# Patient Record
Sex: Male | Born: 1985 | Hispanic: Yes | Marital: Married | Smoking: Never smoker
Health system: Southern US, Community
[De-identification: ages and names within clinical notes are randomized; demographics above are authoritative.]

---

## 2017-03-14 ENCOUNTER — Encounter (HOSPITAL_COMMUNITY): Payer: Self-pay | Admitting: Emergency Medicine

## 2017-03-14 ENCOUNTER — Emergency Department (HOSPITAL_COMMUNITY)
Admission: EM | Admit: 2017-03-14 | Discharge: 2017-03-14 | Disposition: A | Discharge status: Home / Self Care | Payer: No Typology Code available for payment source | Attending: Emergency Medicine | Admitting: Emergency Medicine

## 2017-03-14 DIAGNOSIS — Y92410 Unspecified street and highway as the place of occurrence of the external cause: Secondary | ICD-10-CM | POA: Insufficient documentation

## 2017-03-14 DIAGNOSIS — Y999 Unspecified external cause status: Secondary | ICD-10-CM | POA: Diagnosis not present

## 2017-03-14 DIAGNOSIS — S0990XA Unspecified injury of head, initial encounter: Secondary | ICD-10-CM | POA: Diagnosis not present

## 2017-03-14 DIAGNOSIS — Y9389 Activity, other specified: Secondary | ICD-10-CM | POA: Diagnosis not present

## 2017-03-14 MED ORDER — LIDOCAINE 5 % EX PTCH: 1.0000 | MEDICATED_PATCH | CUTANEOUS | 0 refills | Status: DC

## 2017-03-14 MED ORDER — METHOCARBAMOL 500 MG PO TABS: 500.0000 mg | ORAL_TABLET | Freq: Two times a day (BID) | ORAL | 0 refills | Status: DC

## 2017-03-14 MED ORDER — IBUPROFEN 600 MG PO TABS: 600.0000 mg | ORAL_TABLET | Freq: Four times a day (QID) | ORAL | 0 refills | Status: DC | PRN

## 2017-03-14 MED ORDER — IBUPROFEN 200 MG PO TABS
600.0000 mg | ORAL_TABLET | Freq: Once | ORAL | Status: AC
  Administered 2017-03-14: 600 mg via ORAL
  Filled 2017-03-14: qty 3

## 2017-03-14 NOTE — Discharge Instructions (Signed)
Expect your soreness to increase over the next 2-3 days. Take it easy, but do not lay around too much as this may make any stiffness worse.  °Antiinflammatory medications: Take 600 mg of ibuprofen every 6 hours or 440 mg (over the counter dose) to 500 mg (prescription dose) of naproxen every 12 hours or for the next 3 days. After this time, these medications may be used as needed for pain. Take these medications with food to avoid upset stomach. Choose only one of these medications, do not take them together.  °Tylenol: Should you continue to have additional pain while taking the ibuprofen or naproxen, you may add in tylenol as needed. Your daily total maximum amount of tylenol from all sources should be limited to 4000mg/day for persons without liver problems, or 2000mg/day for those with liver problems. °Muscle relaxer: Robaxin is a muscle relaxer and may help loosen stiff muscles. Do not take the Robaxin while driving or performing other dangerous activities.  °Lidocaine patches: These are available via either prescription or over-the-counter. The over-the-counter option may be more economical one and are likely just as effective. There are multiple over-the-counter brands, such as Salonpas. °Exercises: Be sure to perform the attached exercises starting with three times a week and working up to performing them daily. This is an essential part of preventing long term problems.  ° °Follow up with a primary care provider for any future management of these complaints. °
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
## 2019-06-06 ENCOUNTER — Emergency Department (HOSPITAL_COMMUNITY): Payer: BLUE CROSS/BLUE SHIELD

## 2019-06-06 ENCOUNTER — Other Ambulatory Visit: Payer: Self-pay

## 2019-06-06 ENCOUNTER — Encounter (HOSPITAL_COMMUNITY): Payer: Self-pay

## 2019-06-06 DIAGNOSIS — R0602 Shortness of breath: Secondary | ICD-10-CM | POA: Diagnosis present

## 2019-06-06 DIAGNOSIS — R072 Precordial pain: Secondary | ICD-10-CM | POA: Insufficient documentation

## 2019-06-06 NOTE — ED Triage Notes (Signed)
Pt reports SOB and feeling like his throat swelling that started about 1 hour ago. He reports that he recently started a new asthma medication and anxiety medication yesterday. Denies any known allergies. No distress noted. States that he has tried his rescue inhaler without relief.

## 2019-06-07 ENCOUNTER — Emergency Department (HOSPITAL_COMMUNITY)
Admission: EM | Admit: 2019-06-07 | Discharge: 2019-06-07 | Disposition: A | Discharge status: Home / Self Care | Payer: BLUE CROSS/BLUE SHIELD | Attending: Emergency Medicine | Admitting: Emergency Medicine

## 2019-06-07 DIAGNOSIS — R072 Precordial pain: Secondary | ICD-10-CM

## 2019-06-07 DIAGNOSIS — R0602 Shortness of breath: Secondary | ICD-10-CM

## 2019-06-07 LAB — CBC WITH DIFFERENTIAL/PLATELET
Abs Immature Granulocytes: 0.02 10*3/uL (ref 0.00–0.07)
Basophils Absolute: 0 10*3/uL (ref 0.0–0.1)
Basophils Relative: 0 %
Eosinophils Absolute: 0.1 10*3/uL (ref 0.0–0.5)
Eosinophils Relative: 1 %
HCT: 43.9 % (ref 39.0–52.0)
Hemoglobin: 13.5 g/dL (ref 13.0–17.0)
Immature Granulocytes: 0 %
Lymphocytes Relative: 33 %
Lymphs Abs: 2.9 10*3/uL (ref 0.7–4.0)
MCH: 25.8 pg — ABNORMAL LOW (ref 26.0–34.0)
MCHC: 30.8 g/dL (ref 30.0–36.0)
MCV: 83.9 fL (ref 80.0–100.0)
Monocytes Absolute: 0.8 10*3/uL (ref 0.1–1.0)
Monocytes Relative: 10 %
Neutro Abs: 4.9 10*3/uL (ref 1.7–7.7)
Neutrophils Relative %: 56 %
Platelets: 324 10*3/uL (ref 150–400)
RBC: 5.23 MIL/uL (ref 4.22–5.81)
RDW: 13.6 % (ref 11.5–15.5)
WBC: 8.7 10*3/uL (ref 4.0–10.5)
nRBC: 0 % (ref 0.0–0.2)

## 2019-06-07 LAB — BASIC METABOLIC PANEL
Anion gap: 10 (ref 5–15)
BUN: 13 mg/dL (ref 6–20)
CO2: 22 mmol/L (ref 22–32)
Calcium: 9.5 mg/dL (ref 8.9–10.3)
Chloride: 105 mmol/L (ref 98–111)
Creatinine, Ser: 0.78 mg/dL (ref 0.61–1.24)
GFR calc Af Amer: >60 mL/min (ref >60)
GFR calc non Af Amer: >60 mL/min (ref >60)
Glucose, Bld: 133 mg/dL — ABNORMAL HIGH (ref 70–99)
Potassium: 3.9 mmol/L (ref 3.5–5.1)
Sodium: 137 mmol/L (ref 135–145)

## 2019-06-07 LAB — TROPONIN I (HIGH SENSITIVITY): Troponin I (High Sensitivity): 4 ng/L (ref <18)

## 2019-06-07 NOTE — ED Notes (Signed)
Pt was verbalized discharge instructions. Pt had no further questions at this time. NAD, 

## 2019-06-07 NOTE — Discharge Instructions (Addendum)

## 2019-06-07 NOTE — ED Provider Notes (Signed)
[facility]-EMERGENCY DEPT Provider Note   CSN: [id number] Arrival date & time: 06/06/19  2129     History   Chief Complaint Chief Complaint  Patient presents with  . Shortness of Breath    HPI Robert Burke is a 33 y.o. male.  HPI: A 33 year old patient presents for evaluation of chest pain. Initial onset of pain was approximately 3-6 hours ago. The patient's chest pain is described as heaviness/pressure/tightness and is not worse with exertion. The patient's chest pain is not middle- or left-sided, is not well-localized, is not sharp and does not radiate to the arms/jaw/neck. The patient does not complain of nausea and denies diaphoresis. The patient has no history of stroke, has no history of peripheral artery disease, has not smoked in the past 90 days, denies any history of treated diabetes, has no relevant family history of coronary artery disease (first degree relative at less than age 63), is not hypertensive, has no history of hypercholesterolemia and does not have an elevated BMI (>=30).   HPI Patient reports he was at home when he began having shortness of breath and chest heaviness.  He thought he was having an asthma attack and he took albuterol without relief.  He also reports cough.  No fevers or vomiting. Reports a previous history of asthma.  No history of CAD/VTE He is a non-smoker.  PMH-none Home Medications    Prior to Admission medications   Not on File    Family History History reviewed. No pertinent family history.  Social History Social History   Tobacco Use  . Smoking status: Never Smoker  . Smokeless tobacco: Never Used  Substance Use Topics  . Alcohol use: Not on file  . Drug use: Not on file     Allergies   Patient has no known allergies.   Review of Systems Review of Systems  Constitutional: Negative for diaphoresis and fever.  Respiratory: Positive for cough, chest tightness and shortness of breath.    Cardiovascular: Positive for chest pain. Negative for leg swelling.  Gastrointestinal: Negative for vomiting.  All other systems reviewed and are negative.    Physical Exam Updated Vital Signs BP (!) 148/102   Pulse 81   Temp 98 F (36.7 C) (Oral)   Resp 18   SpO2 100%   Physical Exam CONSTITUTIONAL: Well developed/well nourished HEAD: Normocephalic/atraumatic EYES: EOMI/PERRL ENMT: Mucous membranes moist NECK: supple no meningeal signs SPINE/BACK:entire spine nontender CV: S1/S2 noted, no murmurs/rubs/gallops noted LUNGS: Lungs are clear to auscultation bilaterally, no apparent distress ABDOMEN: soft, nontender, no rebound or guarding, bowel sounds noted throughout abdomen GU:no cva tenderness NEURO: Pt is awake/alert/appropriate, moves all extremitiesx4.  No facial droop.   EXTREMITIES: pulses normal/equalx4, full ROM, no calf tenderness or edema SKIN: warm, color normal PSYCH: no abnormalities of mood noted, alert and oriented to situation   ED Treatments / Results  Labs (all labs ordered are listed, but only abnormal results are displayed) Labs Reviewed  BASIC METABOLIC PANEL - Abnormal; Notable for the following components:      Result Value   Glucose, Bld 133 (*)    All other components within normal limits  CBC WITH DIFFERENTIAL/PLATELET - Abnormal; Notable for the following components:   MCH 25.8 (*)    All other components within normal limits  TROPONIN I (HIGH SENSITIVITY)    EKG EKG Interpretation  Date/Time:  Saturday June 06 2019 21:49:23 EST Ventricular Rate:  87 PR Interval:    QRS Duration: 96 QT  Interval:  349 QTC Calculation: 420 R Axis:   32 Text Interpretation: Sinus rhythm Nonspecific T abnormalities, inferior leads Abnormal ekg No previous ECGs available Confirmed by Ripley Fraise [phone]) on 06/06/2019 11:08:50 PM   Radiology Dg Chest 2 View  Result Date: 06/06/2019 CLINICAL DATA:  Initial evaluation for acute shortness of  breath. EXAM: CHEST - 2 VIEW COMPARISON:  None. FINDINGS: Cardiac and mediastinal silhouettes are within normal limits. Lungs normally inflated. No focal infiltrate, pulmonary edema, or pleural effusion. No pneumothorax. No acute osseous finding. IMPRESSION: No active cardiopulmonary disease. Electronically Signed   By: Jeannine Boga M.D.   On: 06/06/2019 22:16    Procedures Procedures  Medications Ordered in ED Medications - No data to display   Initial Impression / Assessment and Plan / ED Course  I have reviewed the triage vital signs and the nursing notes.  Pertinent labs & imaging results that were available during my care of the patient were reviewed by me and considered in my medical decision making (see chart for details).     HEAR Score: 1  12:34 AM Chest x-ray reviewed and is negative.  Patient did have abnormal EKG but no old to compare. We will obtain labs including troponin and reassess.  Patient reports already feeling improved. 2:52 AM Patient with an initial low troponin with symptoms occurred several hours ago.  My suspicion for ACS at this time is low.  It is unclear if EKG findings are acute as we have no old to compare.  He is otherwise low risk for CAD.  He appears PERC negative.  I feel he is appropriate for discharge home.  Patient reports he felt this could be asthma with some additional anxiety. Suspicion for ACS/PE/dissection at this time Patient denies any fever to suggest COVID-19 Final Clinical Impressions(s) / ED Diagnoses   Final diagnoses:  Precordial pain  SOB (shortness of breath)    ED Discharge Orders    None       Ripley Fraise, MD 06/07/19 [phone]

## 2019-06-12 ENCOUNTER — Other Ambulatory Visit: Payer: Self-pay

## 2019-06-12 ENCOUNTER — Institutional Professional Consult (permissible substitution): Payer: BLUE CROSS/BLUE SHIELD | Admitting: Pulmonary Disease

## 2019-06-12 ENCOUNTER — Encounter: Payer: Self-pay | Admitting: Pulmonary Disease

## 2019-06-12 ENCOUNTER — Ambulatory Visit (INDEPENDENT_AMBULATORY_CARE_PROVIDER_SITE_OTHER): Payer: BLUE CROSS/BLUE SHIELD | Admitting: Pulmonary Disease

## 2019-06-12 VITALS — BP 118/68 | HR 80 | Temp 98.0°F | Ht 66.0 in | Wt 198.6 lb

## 2019-06-12 DIAGNOSIS — G4733 Obstructive sleep apnea (adult) (pediatric): Secondary | ICD-10-CM

## 2019-06-12 DIAGNOSIS — J4540 Moderate persistent asthma, uncomplicated: Secondary | ICD-10-CM

## 2019-06-12 NOTE — Progress Notes (Signed)
Robert Burke    [id number]    03/30/1986  Primary Care Physician:[place]  Referring Physician: Etter Sjogren [place],  [place] [id number]  Chief complaint: Consult for asthma  HPI: 33 year old with moderate persistent asthma, generalized anxiety disorder, allergic rhinitis Has history of childhood asthma.  He is not on regular inhalers as an adult.  Complains of worsening dyspnea on exertion for the past 6 months.  He was started on Symbicort and Singulair 1 to 2 months ago with mild improvement in breathing.  Evaluated in the ED on 11/8 with chest tightness and atypical chest pain.  Chest x-ray and troponins were normal.  It was felt that his presentation was secondary to asthma and anxiety  Has symptoms of daytime somnolence, snoring at night.  He had been referred to sleep study but not completed yet.  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  No mold, hot tub, Jacuzzi.  No feather pillows or comforters Smoking history: Never smoker Travel history: Originally from [place].  Moved to [place] in 2017 Relevant family history: No significant family history of lung disease  Outpatient Encounter Medications as of 06/12/2019  Medication Sig  . acetaminophen (TYLENOL) 500 MG tablet Take 1,000 mg by mouth every 6 (six) hours as needed.  Marland Kitchen albuterol (PROAIR HFA) 108 (90 Base) MCG/ACT inhaler Inhale 2 puffs into the lungs every 4 (four) hours as needed for wheezing or shortness of breath.   . hydrOXYzine (VISTARIL) 25 MG capsule Take 25 mg by mouth every 8 (eight) hours as needed for anxiety.   . montelukast (SINGULAIR) 10 MG tablet Take 10 mg by mouth daily.  . sertraline (ZOLOFT) 50 MG tablet Take 25 mg by mouth daily.  . sodium chloride (OCEAN) 0.65 % SOLN nasal spray Place 1 spray into both nostrils as needed for congestion.  .  SYMBICORT 80-4.5 MCG/ACT inhaler Inhale 2 puffs into the lungs daily.    No facility-administered encounter medications on file as of 06/12/2019.     Allergies as of 06/12/2019  . (No Known Allergies)    History reviewed. No pertinent past medical history.  History reviewed. No pertinent surgical history.  History reviewed. No pertinent family history.  Social History   Socioeconomic History  . Marital status: Married    Spouse name: Not on file  . Number of children: Not on file  . Years of education: Not on file  . Highest education level: Not on file  Occupational History  . Not on file  Social Needs  . Financial resource strain: Not on file  . Food insecurity    Worry: Not on file    Inability: Not on file  . Transportation needs    Medical: Not on file    Non-medical: Not on file  Tobacco Use  . Smoking status: Never Smoker  . Smokeless tobacco: Never Used  Substance and Sexual Activity  . Alcohol use: Not on file  . Drug use: Not on file  . Sexual activity: Not on file  Lifestyle  . Physical activity    Days per week: Not on file    Minutes per session: Not on file  . Stress: Not on file  Relationships  . Social Herbalist on phone: Not on file    Gets together: Not on file    Attends religious service:  Not on file    Active member of club or organization: Not on file    Attends meetings of clubs or organizations: Not on file    Relationship status: Not on file  . Intimate partner violence    Fear of current or ex partner: Not on file    Emotionally abused: Not on file    Physically abused: Not on file    Forced sexual activity: Not on file  Other Topics Concern  . Not on file  Social History Narrative  . Not on file    Review of systems: Review of Systems  Constitutional: Negative for fever and chills.  HENT: Negative.   Eyes: Negative for blurred vision.  Respiratory: as per HPI  Cardiovascular: Negative for chest pain and  palpitations.  Gastrointestinal: Negative for vomiting, diarrhea, blood per rectum. Genitourinary: Negative for dysuria, urgency, frequency and hematuria.  Musculoskeletal: Negative for myalgias, back pain and joint pain.  Skin: Negative for itching and rash.  Neurological: Negative for dizziness, tremors, focal weakness, seizures and loss of consciousness.  Endo/Heme/Allergies: Negative for environmental allergies.  Psychiatric/Behavioral: Negative for depression, suicidal ideas and hallucinations.  All other systems reviewed and are negative.  Physical Exam: Blood pressure 118/68, pulse 80, temperature 98 F (36.7 C), temperature source Temporal, height 5\' 6"  (1.676 m), weight 198 lb 9.6 oz (90.1 kg), SpO2 99 %. Gen:      No acute distress HEENT:  EOMI, sclera anicteric Neck:     No masses; no thyromegaly Lungs:    Clear to auscultation bilaterally; normal respiratory effort CV:         Regular rate and rhythm; no murmurs Abd:      + bowel sounds; soft, non-tender; no palpable masses, no distension Ext:    No edema; adequate peripheral perfusion Skin:      Warm and dry; no rash Neuro: alert and oriented x 3 Psych: normal mood and affect  Data Reviewed: Imaging: Chest x-ray 06/06/2019-no acute cardiopulmonary disease.  I have reviewed the images personally  PFTs:  ACT score 06/12/2019- 8  Labs: CBC 06/07/2019-WBC 8.7, eos 1%, absolute eosinophil count 87  Assessment:  Asthma Not wheezing in office today We will continue Symbicort, Singulair and albuterol inhaler Check pulmonary function testing IgE  Suspected sleep apnea He is interested in rescheduling the sleep study Order home sleep test.  Plan/Recommendations: - Continue Symbicort, Singulair - PFTs, IgE - Home sleep study  13/02/2019 MD  Pulmonary and Critical Care 06/12/2019, 8:49 AM  CC: 06/14/2019, P*

## 2019-06-12 NOTE — Patient Instructions (Addendum)
Continue the Symbicort inhaler and singular We will schedule you for pulmonary function test for better assessment of asthma Schedule a home sleep study for evaluation of sleep apnea Check IgE today  Follow-up in 4 weeks.

## 2019-06-15 LAB — IGE: IgE (Immunoglobulin E), Serum: 38 kU/L (ref <=114)

## 2019-07-13 ENCOUNTER — Telehealth (INDEPENDENT_AMBULATORY_CARE_PROVIDER_SITE_OTHER): Payer: BLUE CROSS/BLUE SHIELD | Admitting: Pulmonary Disease

## 2019-07-13 ENCOUNTER — Encounter: Payer: Self-pay | Admitting: Pulmonary Disease

## 2019-07-13 DIAGNOSIS — J4540 Moderate persistent asthma, uncomplicated: Secondary | ICD-10-CM

## 2019-07-13 MED ORDER — SYMBICORT 80-4.5 MCG/ACT IN AERO: 2.0000 | INHALATION_SPRAY | Freq: Every day | RESPIRATORY_TRACT | 5 refills | Status: AC

## 2019-07-13 NOTE — Progress Notes (Signed)
No show

## 2019-07-13 NOTE — Patient Instructions (Signed)
Am glad you are doing well with your breathing Continue the Symbicort inhaler.  Will call in a prescription for this We will make sure that the home sleep study is scheduled  Follow-up in 3 months.

## 2019-08-07 ENCOUNTER — Inpatient Hospital Stay (HOSPITAL_COMMUNITY): Admission: RE | Admit: 2019-08-07 | Payer: BLUE CROSS/BLUE SHIELD | Source: Ambulatory Visit

## 2019-11-10 ENCOUNTER — Telehealth: Payer: Self-pay | Admitting: Pulmonary Disease

## 2019-11-10 NOTE — Telephone Encounter (Signed)
FYI Dr. Mannam  

## 2019-11-10 NOTE — Telephone Encounter (Signed)
FYI - PM placed order on 06/12/19 for pt to have hst.  I have spoken to pt several times and he was wanting to check with insurance company before scheduling hst to see what his responsibility would be.  We already had it precerted but he didn't know how much he would have to pay.  Spoke to pt today and he has decided to hold off on sleep study because insurance company will not give him an answer as to his responsibility.

## 2020-03-22 IMAGING — CR DG CHEST 2V
2 series · 2 of 2 positions shown · non-contrast
Comparison: None.

CLINICAL DATA: Initial evaluation for acute shortness of breath.

EXAM:
CHEST - 2 VIEW

[w chest pa]
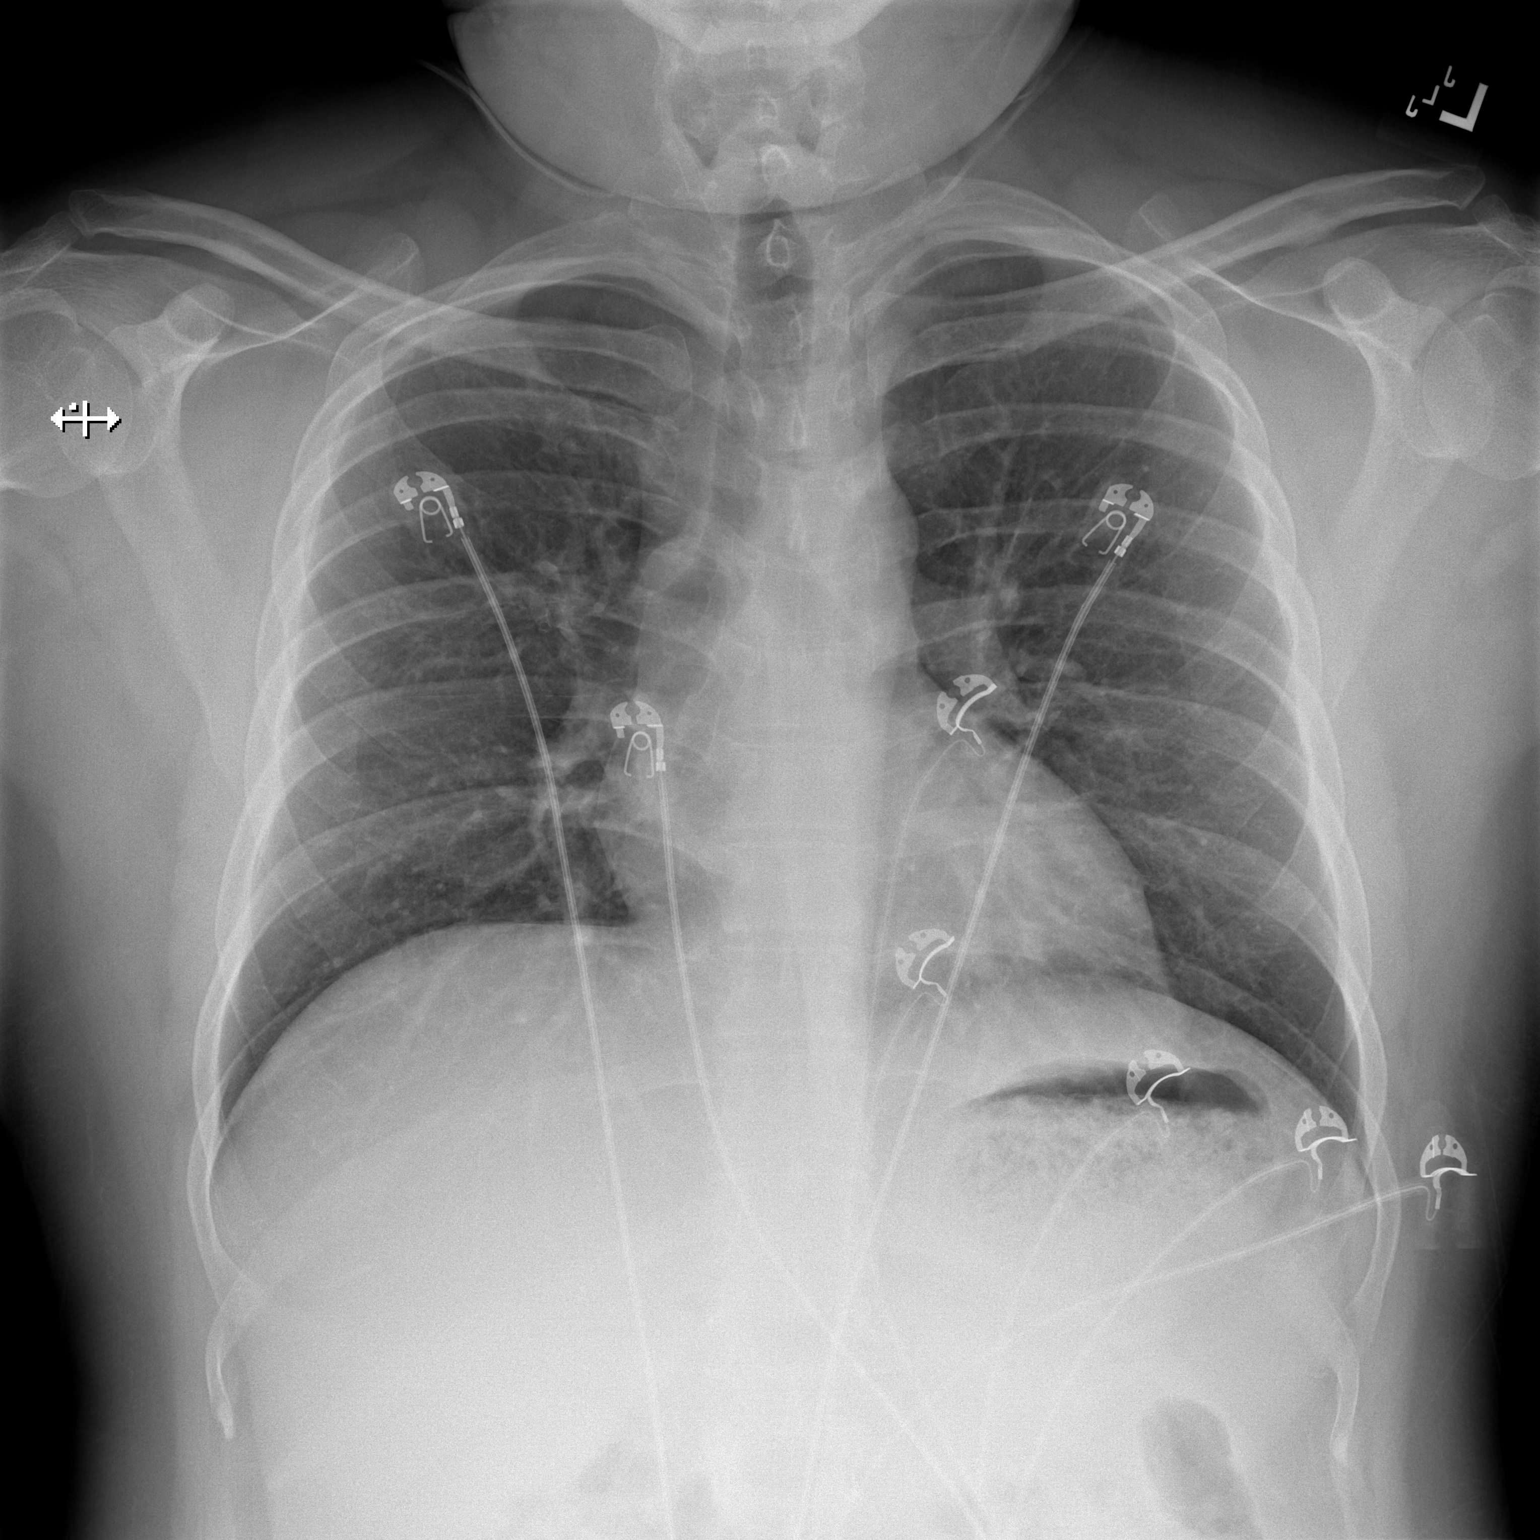

[w chest lat]
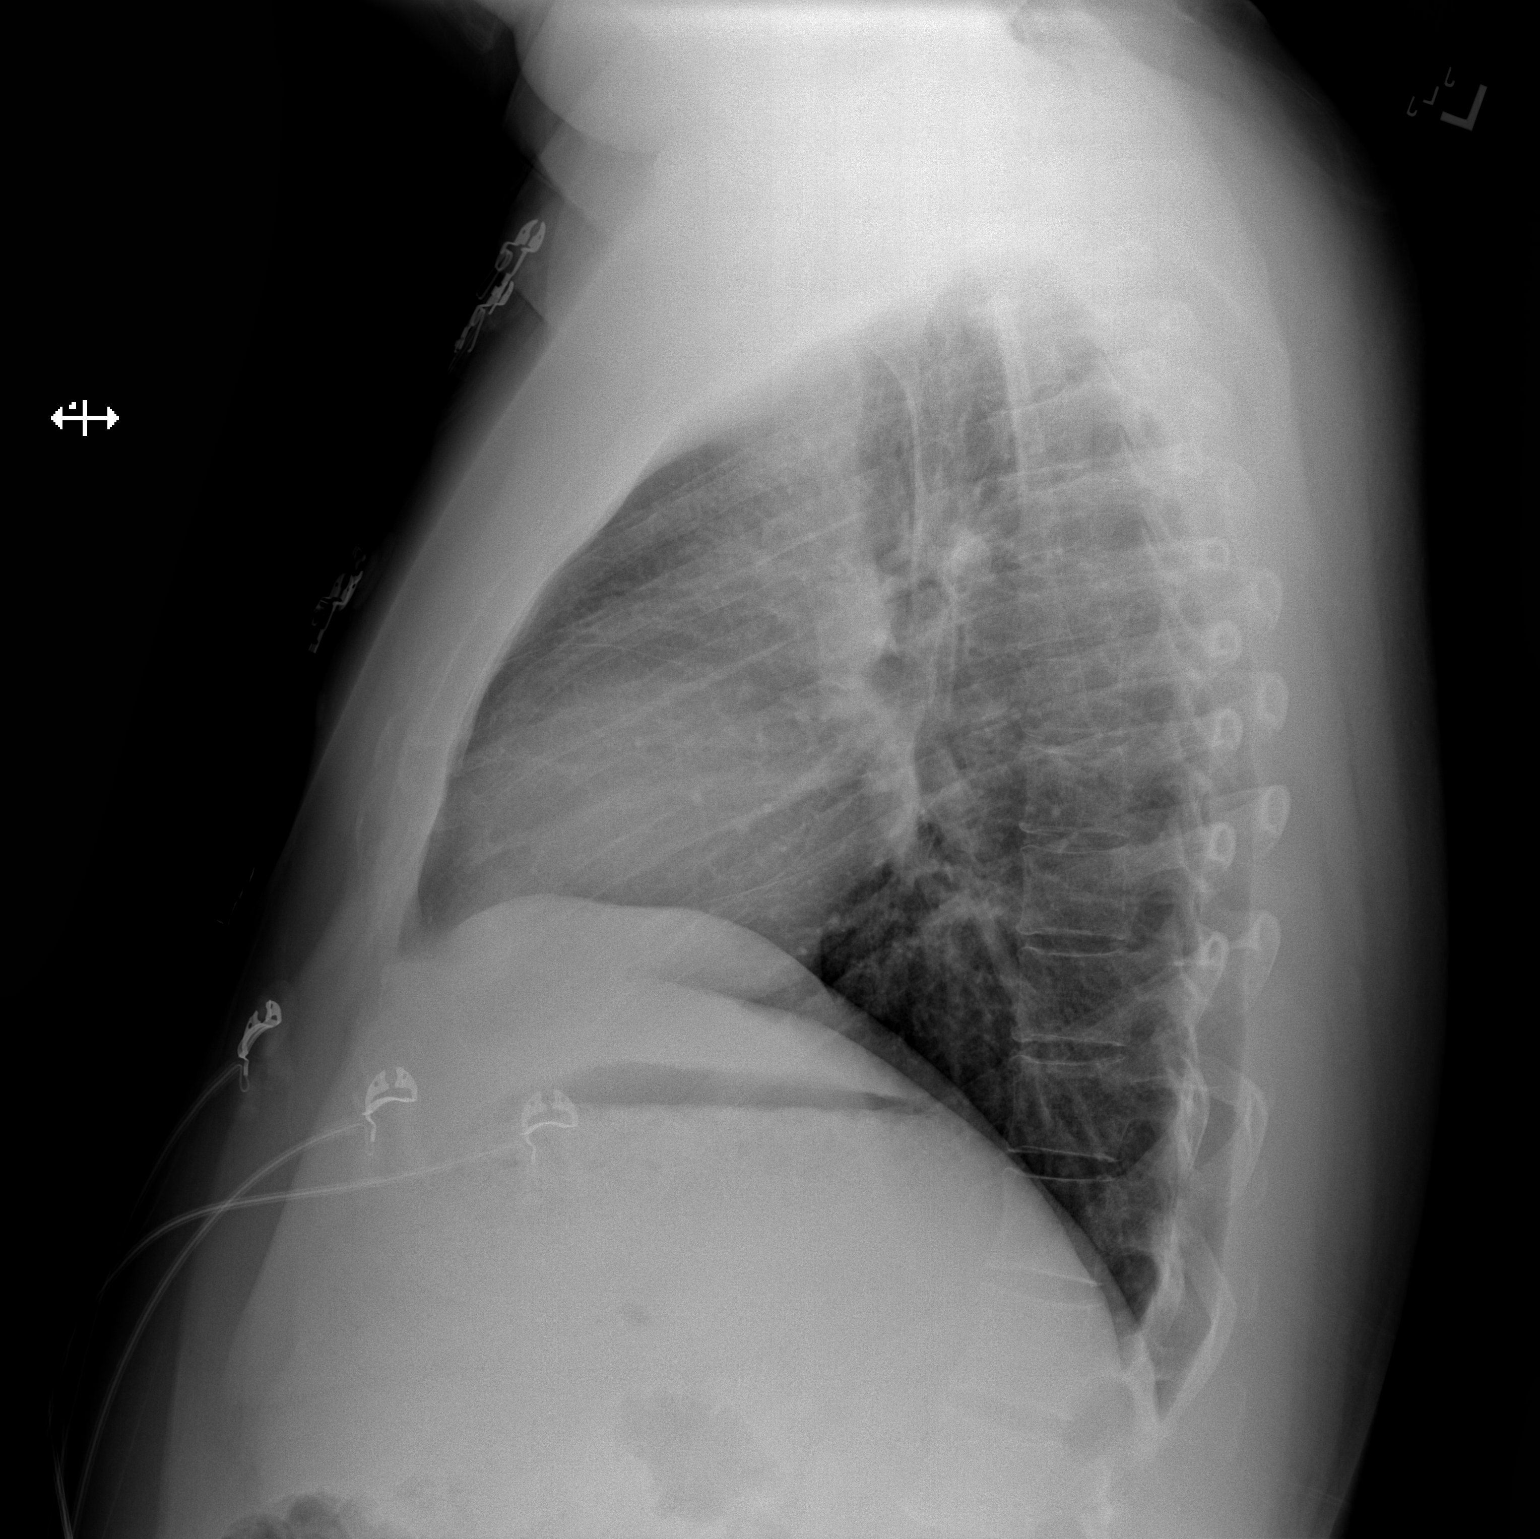

[2 of 2 positions shown; findings below may reference images not displayed]

FINDINGS: Cardiac and mediastinal silhouettes are within normal limits.

Lungs normally inflated. No focal infiltrate, pulmonary edema, or
pleural effusion. No pneumothorax.

No acute osseous finding.
IMPRESSION: No active cardiopulmonary disease.
